# Patient Record
Sex: Female | Born: 1965 | Race: Black or African American | Hispanic: No | Marital: Single | Smoking: Current every day smoker
Health system: Southern US, Community
[De-identification: ages and names within clinical notes are randomized; demographics above are authoritative.]

---

## 2015-05-28 ENCOUNTER — Encounter (HOSPITAL_COMMUNITY): Payer: Self-pay | Admitting: Emergency Medicine

## 2015-05-28 ENCOUNTER — Emergency Department (HOSPITAL_COMMUNITY)
Admission: EM | Admit: 2015-05-28 | Discharge: 2015-05-28 | Disposition: A | Discharge status: Home / Self Care | Payer: Medicare Other | Attending: Emergency Medicine | Admitting: Emergency Medicine

## 2015-05-28 ENCOUNTER — Emergency Department (HOSPITAL_COMMUNITY): Payer: Medicare Other

## 2015-05-28 DIAGNOSIS — S199XXA Unspecified injury of neck, initial encounter: Secondary | ICD-10-CM | POA: Diagnosis not present

## 2015-05-28 DIAGNOSIS — F17210 Nicotine dependence, cigarettes, uncomplicated: Secondary | ICD-10-CM | POA: Insufficient documentation

## 2015-05-28 DIAGNOSIS — Z041 Encounter for examination and observation following transport accident: Secondary | ICD-10-CM | POA: Diagnosis not present

## 2015-05-28 DIAGNOSIS — R51 Headache: Secondary | ICD-10-CM | POA: Insufficient documentation

## 2015-05-28 DIAGNOSIS — Y9389 Activity, other specified: Secondary | ICD-10-CM | POA: Diagnosis not present

## 2015-05-28 DIAGNOSIS — Y92410 Unspecified street and highway as the place of occurrence of the external cause: Secondary | ICD-10-CM | POA: Diagnosis not present

## 2015-05-28 DIAGNOSIS — Z880 Allergy status to penicillin: Secondary | ICD-10-CM | POA: Diagnosis not present

## 2015-05-28 DIAGNOSIS — Y998 Other external cause status: Secondary | ICD-10-CM | POA: Insufficient documentation

## 2015-05-28 MED ORDER — IBUPROFEN 600 MG PO TABS: 600.0000 mg | ORAL_TABLET | Freq: Four times a day (QID) | ORAL | Status: AC | PRN

## 2015-05-28 NOTE — ED Notes (Signed)
[place] [employer] at bedside.
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
## 2015-05-28 NOTE — Discharge Instructions (Signed)
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
